# Patient Record
Sex: Male | Born: 1970 | Race: White | Hispanic: No | Marital: Single | Smoking: Former smoker
Health system: Southern US, Community
[De-identification: ages and names within clinical notes are randomized; demographics above are authoritative.]

## PROBLEM LIST (undated history)

## (undated) DIAGNOSIS — N200 Calculus of kidney: Secondary | ICD-10-CM

## (undated) DIAGNOSIS — E785 Hyperlipidemia, unspecified: Secondary | ICD-10-CM

## (undated) DIAGNOSIS — I10 Essential (primary) hypertension: Secondary | ICD-10-CM

## (undated) HISTORY — PX: TONSILLECTOMY: SUR1361

## (undated) HISTORY — PX: APPENDECTOMY: SHX54

---

## 2015-05-12 ENCOUNTER — Emergency Department
Admission: EM | Admit: 2015-05-12 | Discharge: 2015-05-12 | Disposition: A | Discharge status: Home / Self Care | Payer: Self-pay | Attending: Emergency Medicine | Admitting: Emergency Medicine

## 2015-05-12 ENCOUNTER — Emergency Department: Payer: Self-pay

## 2015-05-12 DIAGNOSIS — N200 Calculus of kidney: Secondary | ICD-10-CM | POA: Insufficient documentation

## 2015-05-12 DIAGNOSIS — Z880 Allergy status to penicillin: Secondary | ICD-10-CM | POA: Insufficient documentation

## 2015-05-12 DIAGNOSIS — I10 Essential (primary) hypertension: Secondary | ICD-10-CM | POA: Insufficient documentation

## 2015-05-12 DIAGNOSIS — E860 Dehydration: Secondary | ICD-10-CM | POA: Insufficient documentation

## 2015-05-12 DIAGNOSIS — Z87891 Personal history of nicotine dependence: Secondary | ICD-10-CM | POA: Insufficient documentation

## 2015-05-12 HISTORY — DX: Hyperlipidemia, unspecified: E78.5

## 2015-05-12 HISTORY — DX: Calculus of kidney: N20.0

## 2015-05-12 HISTORY — DX: Essential (primary) hypertension: I10

## 2015-05-12 LAB — CBC
HEMATOCRIT: 47.3 % (ref 40.0–52.0)
Hemoglobin: 15.4 g/dL (ref 13.0–18.0)
MCH: 30.9 pg (ref 26.0–34.0)
MCHC: 32.6 g/dL (ref 32.0–36.0)
MCV: 94.7 fL (ref 80.0–100.0)
Platelets: 249 10*3/uL (ref 150–440)
RBC: 4.99 MIL/uL (ref 4.40–5.90)
RDW: 12.9 % (ref 11.5–14.5)
WBC: 10.1 10*3/uL (ref 3.8–10.6)

## 2015-05-12 LAB — URINALYSIS COMPLETE WITH MICROSCOPIC (ARMC ONLY)
Bilirubin Urine: NEGATIVE
Glucose, UA: NEGATIVE mg/dL
LEUKOCYTES UA: NEGATIVE
Nitrite: NEGATIVE
Protein, ur: 30 mg/dL — AB
SPECIFIC GRAVITY, URINE: 1.026 (ref 1.005–1.030)
pH: 6 (ref 5.0–8.0)

## 2015-05-12 LAB — BASIC METABOLIC PANEL
Anion gap: 12 (ref 5–15)
BUN: 17 mg/dL (ref 6–20)
CO2: 26 mmol/L (ref 22–32)
CREATININE: 0.98 mg/dL (ref 0.61–1.24)
Calcium: 9.7 mg/dL (ref 8.9–10.3)
Chloride: 105 mmol/L (ref 101–111)
GFR calc Af Amer: >60 mL/min (ref >60)
Glucose, Bld: 133 mg/dL — ABNORMAL HIGH (ref 65–99)
Potassium: 4 mmol/L (ref 3.5–5.1)
Sodium: 143 mmol/L (ref 135–145)

## 2015-05-12 MED ORDER — OXYCODONE-ACETAMINOPHEN 5-325 MG PO TABS
2.0000 | ORAL_TABLET | Freq: Once | ORAL | Status: AC
  Administered 2015-05-12: 2 via ORAL
  Filled 2015-05-12: qty 2

## 2015-05-12 MED ORDER — TAMSULOSIN HCL 0.4 MG PO CAPS: 0.4000 mg | ORAL_CAPSULE | Freq: Every day | ORAL | Status: AC

## 2015-05-12 MED ORDER — KETOROLAC TROMETHAMINE 60 MG/2ML IM SOLN
INTRAMUSCULAR | Status: AC
  Administered 2015-05-12: 30 mg
  Filled 2015-05-12: qty 2

## 2015-05-12 MED ORDER — SODIUM CHLORIDE 0.9 % IV SOLN: Freq: Once | INTRAVENOUS | Status: DC

## 2015-05-12 MED ORDER — SODIUM CHLORIDE 0.9 % IV BOLUS (SEPSIS)
1000.0000 mL | Freq: Once | INTRAVENOUS | Status: AC
  Administered 2015-05-12 (×2): 1000 mL via INTRAVENOUS

## 2015-05-12 MED ORDER — ONDANSETRON HCL 4 MG/2ML IJ SOLN
4.0000 mg | Freq: Once | INTRAMUSCULAR | Status: AC
  Administered 2015-05-12: 4 mg via INTRAVENOUS

## 2015-05-12 MED ORDER — OXYCODONE-ACETAMINOPHEN 5-325 MG PO TABS: 1.0000 | ORAL_TABLET | Freq: Four times a day (QID) | ORAL | Status: AC | PRN

## 2015-05-12 MED ORDER — ONDANSETRON HCL 4 MG/2ML IJ SOLN
INTRAMUSCULAR | Status: AC
  Administered 2015-05-12: 4 mg via INTRAVENOUS
  Filled 2015-05-12: qty 2

## 2015-05-12 MED ORDER — KETOROLAC TROMETHAMINE 30 MG/ML IJ SOLN: 30.0000 mg | Freq: Once | INTRAMUSCULAR | Status: DC

## 2015-05-12 NOTE — ED Notes (Signed)
Pt c/o L flank pain for the past several days, BL in the past day with a feeling like something was stuck in his penis with a hx of kidney stones

## 2015-05-12 NOTE — ED Provider Notes (Signed)
[facility] Emergency Department Provider Note     Time seen: ----------------------------------------- 2:20 PM on 05/12/2015 -----------------------------------------    I have reviewed the triage vital signs and the nursing notes.   HISTORY  Chief Complaint Flank Pain    HPI Richard Kirk is a 44 y.o. male who presents to ER with flank pain for several days. Patient states was bilateral and it seemed to be worse on the left side. Also states he felt like something was stuck in his penis and is had difficulty urinating since today. He does work out in the heat, does think he can be dehydrated and that he has gone through several 24 packs of water. Because of a chronic history of kidney stones but hasn't had them in years. Denies fevers chills or other complaints. Main complaint currently is flank pain as well as oliguria.   Past Medical History  Diagnosis Date  . Kidney stone   . Hypertension   . Hyperlipemia     There are no active problems to display for this patient.   Past Surgical History  Procedure Laterality Date  . Tonsillectomy    . Appendectomy      Allergies Bee venom and Penicillins  Social History History  Substance Use Topics  . Smoking status: Former Games developer  . Smokeless tobacco: Never Used  . Alcohol Use: Yes    Review of Systems Constitutional: Negative for fever. Eyes: Negative for visual changes. ENT: Negative for sore throat. Cardiovascular: Negative for chest pain. Respiratory: Negative for shortness of breath. Gastrointestinal: Positive for abdominal pain Genitourinary: Positive for decreased urine output, dysuria Musculoskeletal: Positive for bilateral low back pain Skin: Negative for rash. Neurological: Negative for headaches, focal weakness or numbness.  10-point ROS otherwise negative.  ____________________________________________   PHYSICAL EXAM:  VITAL SIGNS: ED Triage Vitals  Enc Vitals Group      BP 05/12/15 1327 193/97 mmHg     Pulse Rate 05/12/15 1327 76     Resp 05/12/15 1327 20     Temp 05/12/15 1327 97.6 F (36.4 C)     Temp Source 05/12/15 1327 Oral     SpO2 05/12/15 1327 100 %     Weight 05/12/15 1327 190 lb (86.183 kg)     Height 05/12/15 1327  (1.778 m)     Head Cir --      Peak Flow --      Pain Score 05/12/15 1327 10     Pain Loc --      Pain Edu? --      Excl. in GC? --     Constitutional: Alert and oriented. Well appearing and in no distress. Eyes: Conjunctivae are normal. PERRL. Normal extraocular movements. ENT   Head: Normocephalic and atraumatic.   Nose: No congestion/rhinnorhea.   Mouth/Throat: Dry mucous membranes   Neck: No stridor. Hematological/Lymphatic/Immunilogical: No cervical lymphadenopathy. Cardiovascular: Normal rate, regular rhythm. Normal and symmetric distal pulses are present in all extremities. No murmurs, rubs, or gallops. Respiratory: Normal respiratory effort without tachypnea nor retractions. Breath sounds are clear and equal bilaterally. No wheezes/rales/rhonchi. Gastrointestinal: Soft and nontender. No distention. No abdominal bruits. There is no CVA tenderness. Musculoskeletal: Nontender with normal range of motion in all extremities. No joint effusions.  No lower extremity tenderness nor edema. Neurologic:  Normal speech and language. No gross focal neurologic deficits are appreciated. Speech is normal. No gait instability. Skin:  Skin is warm, dry and intact. No rash noted. Psychiatric: Mood and affect are  normal. Speech and behavior are normal. Patient exhibits appropriate insight and judgment.  ____________________________________________  ED COURSE:  Pertinent labs & imaging results that were available during my care of the patient were reviewed by me and considered in my medical decision making (see chart for details). Patient likely with renal colic or recently passed  stone. ____________________________________________    LABS (pertinent positives/negatives)  Labs Reviewed  BASIC METABOLIC PANEL - Abnormal; Notable for the following:    Glucose, Bld 133 (*)    All other components within normal limits  URINALYSIS COMPLETEWITH MICROSCOPIC ([facility] ONLY) - Abnormal; Notable for the following:    Color, Urine YELLOW (*)    APPearance CLEAR (*)    Ketones, ur TRACE (*)    Hgb urine dipstick 2+ (*)    Protein, ur 30 (*)    Bacteria, UA RARE (*)    Squamous Epithelial / LPF 0-5 (*)    All other components within normal limits  URINE CULTURE  CBC    RADIOLOGY  KUB reveals phlebolith but no obvious active stones  ____________________________________________  FINAL ASSESSMENT AND PLAN  Flank pain, dysuria  Plan: Patient with labs and imaging as dictated above. Patient significantly high dehydrated. Urine culture was sent. He was given 2 L of normal saline, pain medication. Likely recently passed stone as well as dehydration from working in the heat. He is stable for outpatient follow-up   [name], MD   [name], MD 05/12/15 [phone]

## 2015-05-12 NOTE — Discharge Instructions (Signed)
Dehydration, Adult °Dehydration is when you lose more fluids from the body than you take in. Vital organs like the kidneys, brain, and heart cannot function without a proper amount of fluids and salt. Any loss of fluids from the body can cause dehydration.  °CAUSES  °· Vomiting. °· Diarrhea. °· Excessive sweating. °· Excessive urine output. °· Fever. °SYMPTOMS  °Mild dehydration °· Thirst. °· Dry lips. °· Slightly dry mouth. °Moderate dehydration °· Very dry mouth. °· Sunken eyes. °· Skin does not bounce back quickly when lightly pinched and released. °· Dark urine and decreased urine production. °· Decreased tear production. °· Headache. °Severe dehydration °· Very dry mouth. °· Extreme thirst. °· Rapid, weak pulse (more than 100 beats per minute at rest). °· Cold hands and feet. °· Not able to sweat in spite of heat and temperature. °· Rapid breathing. °· Blue lips. °· Confusion and lethargy. °· Difficulty being awakened. °· Minimal urine production. °· No tears. °DIAGNOSIS  °Your caregiver will diagnose dehydration based on your symptoms and your exam. Blood and urine tests will help confirm the diagnosis. The diagnostic evaluation should also identify the cause of dehydration. °TREATMENT  °Treatment of mild or moderate dehydration can often be done at home by increasing the amount of fluids that you drink. It is best to drink small amounts of fluid more often. Drinking too much at one time can make vomiting worse. Refer to the home care instructions below. °Severe dehydration needs to be treated at the hospital where you will probably be given intravenous (IV) fluids that contain water and electrolytes. °HOME CARE INSTRUCTIONS  °· Ask your caregiver about specific rehydration instructions. °· Drink enough fluids to keep your urine clear or pale yellow. °· Drink small amounts frequently if you have nausea and vomiting. °· Eat as you normally do. °· Avoid: °· Foods or drinks high in sugar. °· Carbonated  drinks. °· Juice. °· Extremely hot or cold fluids. °· Drinks with caffeine. °· Fatty, greasy foods. °· Alcohol. °· Tobacco. °· Overeating. °· Gelatin desserts. °· Wash your hands well to avoid spreading bacteria and viruses. °· Only take over-the-counter or prescription medicines for pain, discomfort, or fever as directed by your caregiver. °· Ask your caregiver if you should continue all prescribed and over-the-counter medicines. °· Keep all follow-up appointments with your caregiver. °SEEK MEDICAL CARE IF: °· You have abdominal pain and it increases or stays in one area (localizes). °· You have a rash, stiff neck, or severe headache. °· You are irritable, sleepy, or difficult to awaken. °· You are weak, dizzy, or extremely thirsty. °SEEK IMMEDIATE MEDICAL CARE IF:  °· You are unable to keep fluids down or you get worse despite treatment. °· You have frequent episodes of vomiting or diarrhea. °· You have blood or green matter (bile) in your vomit. °· You have blood in your stool or your stool looks black and tarry. °· You have not urinated in 6 to 8 hours, or you have only urinated a small amount of very dark urine. °· You have a fever. °· You faint. °MAKE SURE YOU:  °· Understand these instructions. °· Will watch your condition. °· Will get help right away if you are not doing well or get worse. °Document Released: 10/02/2005 Document Revised: 12/25/2011 Document Reviewed: 05/22/2011 °[employer]® Patient Information ©2015 [employer]. This information is not intended to replace advice given to you by your health care provider. Make sure you discuss any questions you have with your health care   provider. ° °Kidney Stones °Kidney stones (urolithiasis) are deposits that form inside your kidneys. The intense pain is caused by the stone moving through the urinary tract. When the stone moves, the ureter goes into spasm around the stone. The stone is usually passed in the urine.  °CAUSES  °· A disorder that makes certain  neck glands produce too much parathyroid hormone (primary hyperparathyroidism). °· A buildup of uric acid crystals, similar to gout in your joints. °· Narrowing (stricture) of the ureter. °· A kidney obstruction present at birth (congenital obstruction). °· Previous surgery on the kidney or ureters. °· Numerous kidney infections. °SYMPTOMS  °· Feeling sick to your stomach (nauseous). °· Throwing up (vomiting). °· Blood in the urine (hematuria). °· Pain that usually spreads (radiates) to the groin. °· Frequency or urgency of urination. °DIAGNOSIS  °· Taking a history and physical exam. °· Blood or urine tests. °· CT scan. °· Occasionally, an examination of the inside of the urinary bladder (cystoscopy) is performed. °TREATMENT  °· Observation. °· Increasing your fluid intake. °· Extracorporeal shock wave lithotripsy--This is a noninvasive procedure that uses shock waves to break up kidney stones. °· Surgery may be needed if you have severe pain or persistent obstruction. There are various surgical procedures. Most of the procedures are performed with the use of small instruments. Only small incisions are needed to accommodate these instruments, so recovery time is minimized. °The size, location, and chemical composition are all important variables that will determine the proper choice of action for you. Talk to your health care provider to better understand your situation so that you will minimize the risk of injury to yourself and your kidney.  °HOME CARE INSTRUCTIONS  °· Drink enough water and fluids to keep your urine clear or pale yellow. This will help you to pass the stone or stone fragments. °· Strain all urine through the provided strainer. Keep all particulate matter and stones for your health care provider to see. The stone causing the pain may be as small as a grain of salt. It is very important to use the strainer each and every time you pass your urine. The collection of your stone will allow your health  care provider to analyze it and verify that a stone has actually passed. The stone analysis will often identify what you can do to reduce the incidence of recurrences. °· Only take over-the-counter or prescription medicines for pain, discomfort, or fever as directed by your health care provider. °· Make a follow-up appointment with your health care provider as directed. °· Get follow-up X-rays if required. The absence of pain does not always mean that the stone has passed. It may have only stopped moving. If the urine remains completely obstructed, it can cause loss of kidney function or even complete destruction of the kidney. It is your responsibility to make sure X-rays and follow-ups are completed. Ultrasounds of the kidney can show blockages and the status of the kidney. Ultrasounds are not associated with any radiation and can be performed easily in a matter of minutes. °SEEK MEDICAL CARE IF: °· You experience pain that is progressive and unresponsive to any pain medicine you have been prescribed. °SEEK IMMEDIATE MEDICAL CARE IF:  °· Pain cannot be controlled with the prescribed medicine. °· You have a fever or shaking chills. °· The severity or intensity of pain increases over 18 hours and is not relieved by pain medicine. °· You develop a new onset of abdominal pain. °· You feel faint   or pass out. °· You are unable to urinate. °MAKE SURE YOU:  °· Understand these instructions. °· Will watch your condition. °· Will get help right away if you are not doing well or get worse. °Document Released: 10/02/2005 Document Revised: 06/04/2013 Document Reviewed: 03/05/2013 °[employer]® Patient Information ©2015 [employer]. This information is not intended to replace advice given to you by your health care provider. Make sure you discuss any questions you have with your health care provider. ° °

## 2016-06-26 IMAGING — CR DG ABDOMEN 1V
1 series · 1 of 1 positions shown · non-contrast
Comparison: None.

CLINICAL DATA: Left flank pain for several days, history of kidney
stones

EXAM:
ABDOMEN - 1 VIEW

[dg abd 1 view]
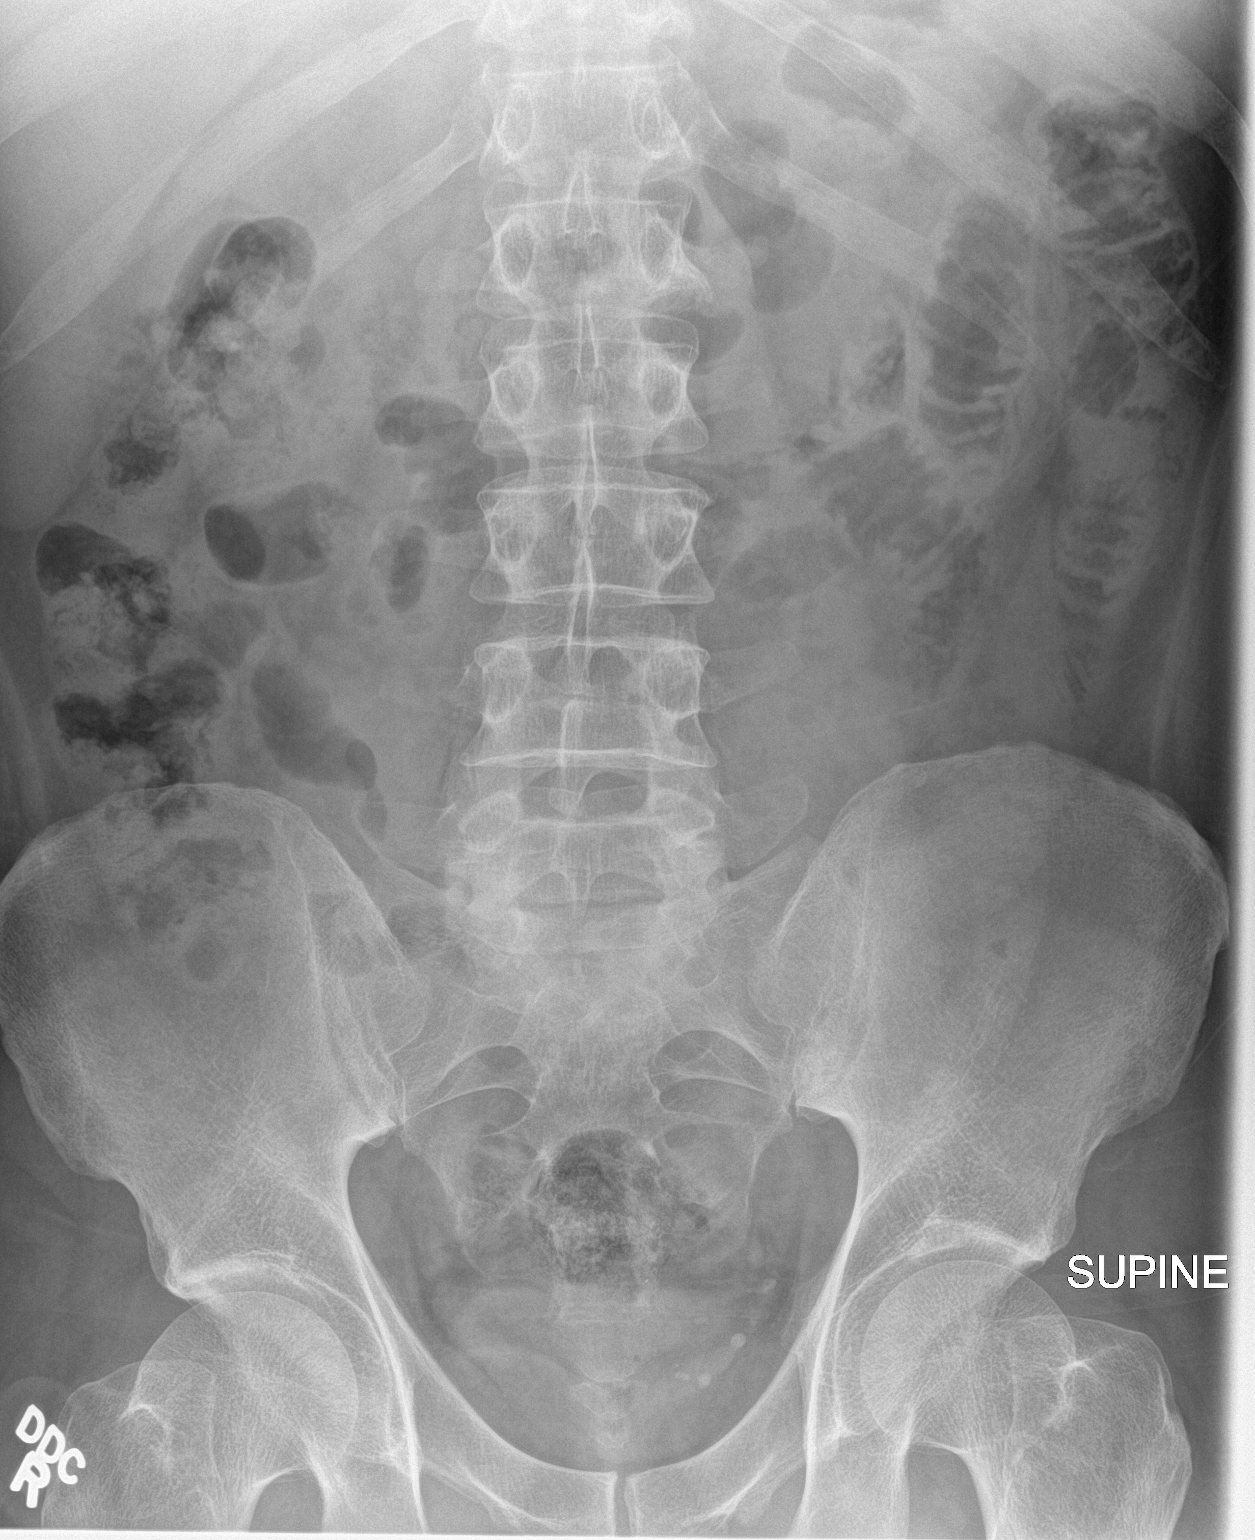

[1 of 1 positions shown; findings below may reference images not displayed]

FINDINGS: Scattered large and small bowel gas is noted. There is a
questionable calculus overlying the lower pole of the right kidney.
It measures approximately 3 mm. Multiple calcifications are noted in
the pelvis felt to represent phleboliths. It would be difficult to
exclude a distal ureteral calculus. No bony abnormality is noted. No
soft tissue mass is seen.
IMPRESSION: Question right lower pole renal calculus.

Multiple phleboliths within the left hemipelvis. It would be
difficult to exclude 1 of these as a ureteral calculus. If
clinically indicated CT urography may be helpful.
